# Patient Record
Sex: Male | Born: 1985
Health system: Southern US, Community
[De-identification: ages and names within clinical notes are randomized; demographics above are authoritative.]

---
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
## 2009-08-21 ENCOUNTER — Encounter: Admission: RE | Admit: 2009-08-21 | Discharge: 2009-09-13 | Payer: Self-pay | Admitting: Family Medicine

## 2009-10-18 ENCOUNTER — Encounter: Admission: RE | Admit: 2009-10-18 | Discharge: 2009-11-29 | Payer: Self-pay | Admitting: Orthopedic Surgery

## 2016-02-26 DIAGNOSIS — H5212 Myopia, left eye: Secondary | ICD-10-CM | POA: Diagnosis not present

## 2016-07-06 DIAGNOSIS — H6123 Impacted cerumen, bilateral: Secondary | ICD-10-CM | POA: Diagnosis not present

## 2016-11-11 ENCOUNTER — Other Ambulatory Visit: Payer: Self-pay | Admitting: *Deleted

## 2016-11-11 ENCOUNTER — Ambulatory Visit
Admission: RE | Admit: 2016-11-11 | Discharge: 2016-11-11 | Disposition: A | Discharge status: Home / Self Care | Payer: BLUE CROSS/BLUE SHIELD | Source: Ambulatory Visit | Attending: Sports Medicine | Admitting: Sports Medicine

## 2016-11-11 DIAGNOSIS — M25562 Pain in left knee: Secondary | ICD-10-CM

## 2016-11-11 DIAGNOSIS — S82122A Displaced fracture of lateral condyle of left tibia, initial encounter for closed fracture: Secondary | ICD-10-CM | POA: Diagnosis not present

## 2016-11-13 ENCOUNTER — Ambulatory Visit (INDEPENDENT_AMBULATORY_CARE_PROVIDER_SITE_OTHER): Payer: BLUE CROSS/BLUE SHIELD | Admitting: Family Medicine

## 2016-11-13 ENCOUNTER — Encounter (INDEPENDENT_AMBULATORY_CARE_PROVIDER_SITE_OTHER): Payer: Self-pay

## 2016-11-13 ENCOUNTER — Encounter: Payer: Self-pay | Admitting: Family Medicine

## 2016-11-13 VITALS — BP 135/82 | Ht 74.75 in | Wt 162.0 lb

## 2016-11-13 DIAGNOSIS — M25562 Pain in left knee: Secondary | ICD-10-CM

## 2016-11-13 NOTE — Progress Notes (Signed)
  Brent SarasWade Allan Chavez - 31 y.o. male MRN [id number]  Date of birth: 17-May-1986  SUBJECTIVE:  Including CC & ROS.   Mr. Brent Chavez is a 31 year old male that is presenting with left knee pain. He was playing in a basketball game on Sunday and had a couple different instances of hurting his knee. He denies hearing a pop. He did have some swelling on the lateral aspect of his knee. He had applied ice to it. He has been having trouble walking after the game. He feels some improvement today but still has some unsteadiness of his knee when he moves certain directions.  ROS: No unexpected weight loss, fever, chills, , instability, muscle pain, numbness/tingling, redness, otherwise see HPI    HISTORY: Past Medical, Surgical, Social, and Family History Reviewed & Updated per EMR.   Pertinent Historical Findings include: PSHx -  Never smoker   DATA REVIEWED: 11/11/16: Left knee x-ray:Small avulsion fracture from the proximal, lateral tibia suspicious for a Segond fracture. MRI is recommended to evaluate for internal derangement.  PHYSICAL EXAM:  VS: BP:135/82  HR: bpm  TEMP: ( )  RESP:   HT:6' 2.75" (189.9 cm)   WT:162 lb (73.5 kg)  BMI:20.4 PHYSICAL EXAM: Gen: NAD, alert, cooperative with exam, well-appearing HEENT: clear conjunctiva, EOMI CV:  no edema, capillary refill brisk,  Resp: non-labored, normal speech Skin: no rashes, normal turgor  Neuro: no gross deficits.  Psych:  alert and oriented Knee: Normal to inspection with no erythema  Trace effusion  Palpation normal with no warmth, joint line tenderness, patellar tenderness, or condyle tenderness. ROM full in flexion and extension and lower leg rotation. Normal and points with valgus and varus stress testing. Negative Mcmurray's Positive Lauchman's. Non painful patellar compression. Patellar glide without crepitus. Patellar and quadriceps tendons unremarkable. Hamstring and quadriceps strength is normal.     ASSESSMENT &  PLAN:   Left knee pain Exam and his x-ray would be consistent with an anterior cruciate ligament tear. - MRI left knee - We will call with results

## 2016-11-15 ENCOUNTER — Encounter: Payer: Self-pay | Admitting: Family Medicine

## 2016-11-15 DIAGNOSIS — M25562 Pain in left knee: Secondary | ICD-10-CM | POA: Insufficient documentation

## 2016-11-15 NOTE — Assessment & Plan Note (Signed)
Exam and his x-ray would be consistent with an anterior cruciate ligament tear. - MRI left knee - We will call with results

## 2016-11-17 ENCOUNTER — Ambulatory Visit
Admission: RE | Admit: 2016-11-17 | Discharge: 2016-11-17 | Disposition: A | Discharge status: Home / Self Care | Payer: BLUE CROSS/BLUE SHIELD | Source: Ambulatory Visit | Attending: Sports Medicine | Admitting: Sports Medicine

## 2016-11-17 DIAGNOSIS — M25562 Pain in left knee: Secondary | ICD-10-CM

## 2016-11-17 DIAGNOSIS — M25462 Effusion, left knee: Secondary | ICD-10-CM | POA: Diagnosis not present

## 2016-11-21 DIAGNOSIS — S83512A Sprain of anterior cruciate ligament of left knee, initial encounter: Secondary | ICD-10-CM | POA: Diagnosis not present

## 2016-12-04 DIAGNOSIS — S83502A Sprain of unspecified cruciate ligament of left knee, initial encounter: Secondary | ICD-10-CM | POA: Diagnosis not present

## 2016-12-04 DIAGNOSIS — Y9364 Activity, baseball: Secondary | ICD-10-CM | POA: Diagnosis not present

## 2016-12-04 DIAGNOSIS — S83282A Other tear of lateral meniscus, current injury, left knee, initial encounter: Secondary | ICD-10-CM | POA: Diagnosis not present

## 2016-12-04 DIAGNOSIS — G8918 Other acute postprocedural pain: Secondary | ICD-10-CM | POA: Diagnosis not present

## 2016-12-04 DIAGNOSIS — S83512A Sprain of anterior cruciate ligament of left knee, initial encounter: Secondary | ICD-10-CM | POA: Diagnosis not present

## 2016-12-12 DIAGNOSIS — S83282D Other tear of lateral meniscus, current injury, left knee, subsequent encounter: Secondary | ICD-10-CM | POA: Diagnosis not present

## 2016-12-19 DIAGNOSIS — M25662 Stiffness of left knee, not elsewhere classified: Secondary | ICD-10-CM | POA: Diagnosis not present

## 2016-12-19 DIAGNOSIS — R262 Difficulty in walking, not elsewhere classified: Secondary | ICD-10-CM | POA: Diagnosis not present

## 2016-12-19 DIAGNOSIS — M6281 Muscle weakness (generalized): Secondary | ICD-10-CM | POA: Diagnosis not present

## 2016-12-19 DIAGNOSIS — S83282D Other tear of lateral meniscus, current injury, left knee, subsequent encounter: Secondary | ICD-10-CM | POA: Diagnosis not present

## 2016-12-19 DIAGNOSIS — M25562 Pain in left knee: Secondary | ICD-10-CM | POA: Diagnosis not present

## 2016-12-24 DIAGNOSIS — M6281 Muscle weakness (generalized): Secondary | ICD-10-CM | POA: Diagnosis not present

## 2016-12-24 DIAGNOSIS — R262 Difficulty in walking, not elsewhere classified: Secondary | ICD-10-CM | POA: Diagnosis not present

## 2016-12-24 DIAGNOSIS — M25562 Pain in left knee: Secondary | ICD-10-CM | POA: Diagnosis not present

## 2016-12-24 DIAGNOSIS — M25662 Stiffness of left knee, not elsewhere classified: Secondary | ICD-10-CM | POA: Diagnosis not present

## 2016-12-26 DIAGNOSIS — M25662 Stiffness of left knee, not elsewhere classified: Secondary | ICD-10-CM | POA: Diagnosis not present

## 2016-12-26 DIAGNOSIS — M25562 Pain in left knee: Secondary | ICD-10-CM | POA: Diagnosis not present

## 2016-12-26 DIAGNOSIS — R262 Difficulty in walking, not elsewhere classified: Secondary | ICD-10-CM | POA: Diagnosis not present

## 2016-12-26 DIAGNOSIS — M6281 Muscle weakness (generalized): Secondary | ICD-10-CM | POA: Diagnosis not present

## 2016-12-31 DIAGNOSIS — R262 Difficulty in walking, not elsewhere classified: Secondary | ICD-10-CM | POA: Diagnosis not present

## 2016-12-31 DIAGNOSIS — M6281 Muscle weakness (generalized): Secondary | ICD-10-CM | POA: Diagnosis not present

## 2016-12-31 DIAGNOSIS — M25562 Pain in left knee: Secondary | ICD-10-CM | POA: Diagnosis not present

## 2016-12-31 DIAGNOSIS — M25662 Stiffness of left knee, not elsewhere classified: Secondary | ICD-10-CM | POA: Diagnosis not present

## 2017-01-02 DIAGNOSIS — M6281 Muscle weakness (generalized): Secondary | ICD-10-CM | POA: Diagnosis not present

## 2017-01-02 DIAGNOSIS — R262 Difficulty in walking, not elsewhere classified: Secondary | ICD-10-CM | POA: Diagnosis not present

## 2017-01-02 DIAGNOSIS — M25562 Pain in left knee: Secondary | ICD-10-CM | POA: Diagnosis not present

## 2017-01-02 DIAGNOSIS — M25662 Stiffness of left knee, not elsewhere classified: Secondary | ICD-10-CM | POA: Diagnosis not present

## 2017-01-07 DIAGNOSIS — R262 Difficulty in walking, not elsewhere classified: Secondary | ICD-10-CM | POA: Diagnosis not present

## 2017-01-07 DIAGNOSIS — M25662 Stiffness of left knee, not elsewhere classified: Secondary | ICD-10-CM | POA: Diagnosis not present

## 2017-01-07 DIAGNOSIS — M25562 Pain in left knee: Secondary | ICD-10-CM | POA: Diagnosis not present

## 2017-01-07 DIAGNOSIS — M6281 Muscle weakness (generalized): Secondary | ICD-10-CM | POA: Diagnosis not present

## 2017-01-09 DIAGNOSIS — M25662 Stiffness of left knee, not elsewhere classified: Secondary | ICD-10-CM | POA: Diagnosis not present

## 2017-01-09 DIAGNOSIS — M6281 Muscle weakness (generalized): Secondary | ICD-10-CM | POA: Diagnosis not present

## 2017-01-09 DIAGNOSIS — M25562 Pain in left knee: Secondary | ICD-10-CM | POA: Diagnosis not present

## 2017-01-09 DIAGNOSIS — R262 Difficulty in walking, not elsewhere classified: Secondary | ICD-10-CM | POA: Diagnosis not present

## 2017-01-14 DIAGNOSIS — M25662 Stiffness of left knee, not elsewhere classified: Secondary | ICD-10-CM | POA: Diagnosis not present

## 2017-01-14 DIAGNOSIS — M25562 Pain in left knee: Secondary | ICD-10-CM | POA: Diagnosis not present

## 2017-01-14 DIAGNOSIS — R262 Difficulty in walking, not elsewhere classified: Secondary | ICD-10-CM | POA: Diagnosis not present

## 2017-01-14 DIAGNOSIS — M6281 Muscle weakness (generalized): Secondary | ICD-10-CM | POA: Diagnosis not present

## 2017-01-16 DIAGNOSIS — M6281 Muscle weakness (generalized): Secondary | ICD-10-CM | POA: Diagnosis not present

## 2017-01-16 DIAGNOSIS — R262 Difficulty in walking, not elsewhere classified: Secondary | ICD-10-CM | POA: Diagnosis not present

## 2017-01-16 DIAGNOSIS — M25662 Stiffness of left knee, not elsewhere classified: Secondary | ICD-10-CM | POA: Diagnosis not present

## 2017-01-16 DIAGNOSIS — M25562 Pain in left knee: Secondary | ICD-10-CM | POA: Diagnosis not present

## 2017-01-21 DIAGNOSIS — M6281 Muscle weakness (generalized): Secondary | ICD-10-CM | POA: Diagnosis not present

## 2017-01-21 DIAGNOSIS — M25662 Stiffness of left knee, not elsewhere classified: Secondary | ICD-10-CM | POA: Diagnosis not present

## 2017-01-21 DIAGNOSIS — M25562 Pain in left knee: Secondary | ICD-10-CM | POA: Diagnosis not present

## 2017-01-21 DIAGNOSIS — R262 Difficulty in walking, not elsewhere classified: Secondary | ICD-10-CM | POA: Diagnosis not present

## 2017-01-23 DIAGNOSIS — M25662 Stiffness of left knee, not elsewhere classified: Secondary | ICD-10-CM | POA: Diagnosis not present

## 2017-01-23 DIAGNOSIS — R262 Difficulty in walking, not elsewhere classified: Secondary | ICD-10-CM | POA: Diagnosis not present

## 2017-01-23 DIAGNOSIS — M6281 Muscle weakness (generalized): Secondary | ICD-10-CM | POA: Diagnosis not present

## 2017-01-23 DIAGNOSIS — M25562 Pain in left knee: Secondary | ICD-10-CM | POA: Diagnosis not present

## 2017-01-28 DIAGNOSIS — M6281 Muscle weakness (generalized): Secondary | ICD-10-CM | POA: Diagnosis not present

## 2017-01-28 DIAGNOSIS — R262 Difficulty in walking, not elsewhere classified: Secondary | ICD-10-CM | POA: Diagnosis not present

## 2017-01-28 DIAGNOSIS — M25662 Stiffness of left knee, not elsewhere classified: Secondary | ICD-10-CM | POA: Diagnosis not present

## 2017-01-28 DIAGNOSIS — M25562 Pain in left knee: Secondary | ICD-10-CM | POA: Diagnosis not present

## 2017-01-30 DIAGNOSIS — M25662 Stiffness of left knee, not elsewhere classified: Secondary | ICD-10-CM | POA: Diagnosis not present

## 2017-01-30 DIAGNOSIS — M6281 Muscle weakness (generalized): Secondary | ICD-10-CM | POA: Diagnosis not present

## 2017-01-30 DIAGNOSIS — R262 Difficulty in walking, not elsewhere classified: Secondary | ICD-10-CM | POA: Diagnosis not present

## 2017-01-30 DIAGNOSIS — M25562 Pain in left knee: Secondary | ICD-10-CM | POA: Diagnosis not present

## 2017-02-12 DIAGNOSIS — R262 Difficulty in walking, not elsewhere classified: Secondary | ICD-10-CM | POA: Diagnosis not present

## 2017-02-12 DIAGNOSIS — M25562 Pain in left knee: Secondary | ICD-10-CM | POA: Diagnosis not present

## 2017-02-12 DIAGNOSIS — M6281 Muscle weakness (generalized): Secondary | ICD-10-CM | POA: Diagnosis not present

## 2017-02-12 DIAGNOSIS — M25662 Stiffness of left knee, not elsewhere classified: Secondary | ICD-10-CM | POA: Diagnosis not present

## 2017-02-18 DIAGNOSIS — R262 Difficulty in walking, not elsewhere classified: Secondary | ICD-10-CM | POA: Diagnosis not present

## 2017-02-18 DIAGNOSIS — M25562 Pain in left knee: Secondary | ICD-10-CM | POA: Diagnosis not present

## 2017-02-18 DIAGNOSIS — M6281 Muscle weakness (generalized): Secondary | ICD-10-CM | POA: Diagnosis not present

## 2017-02-18 DIAGNOSIS — M25662 Stiffness of left knee, not elsewhere classified: Secondary | ICD-10-CM | POA: Diagnosis not present

## 2017-02-20 DIAGNOSIS — M25562 Pain in left knee: Secondary | ICD-10-CM | POA: Diagnosis not present

## 2017-02-20 DIAGNOSIS — M6281 Muscle weakness (generalized): Secondary | ICD-10-CM | POA: Diagnosis not present

## 2017-02-20 DIAGNOSIS — M25662 Stiffness of left knee, not elsewhere classified: Secondary | ICD-10-CM | POA: Diagnosis not present

## 2017-02-20 DIAGNOSIS — R262 Difficulty in walking, not elsewhere classified: Secondary | ICD-10-CM | POA: Diagnosis not present

## 2017-02-27 DIAGNOSIS — M25662 Stiffness of left knee, not elsewhere classified: Secondary | ICD-10-CM | POA: Diagnosis not present

## 2017-02-27 DIAGNOSIS — R262 Difficulty in walking, not elsewhere classified: Secondary | ICD-10-CM | POA: Diagnosis not present

## 2017-02-27 DIAGNOSIS — M25562 Pain in left knee: Secondary | ICD-10-CM | POA: Diagnosis not present

## 2017-02-27 DIAGNOSIS — M6281 Muscle weakness (generalized): Secondary | ICD-10-CM | POA: Diagnosis not present

## 2017-03-05 DIAGNOSIS — M25562 Pain in left knee: Secondary | ICD-10-CM | POA: Diagnosis not present

## 2017-03-05 DIAGNOSIS — M25662 Stiffness of left knee, not elsewhere classified: Secondary | ICD-10-CM | POA: Diagnosis not present

## 2017-03-05 DIAGNOSIS — R262 Difficulty in walking, not elsewhere classified: Secondary | ICD-10-CM | POA: Diagnosis not present

## 2017-03-05 DIAGNOSIS — M6281 Muscle weakness (generalized): Secondary | ICD-10-CM | POA: Diagnosis not present

## 2017-03-06 DIAGNOSIS — R262 Difficulty in walking, not elsewhere classified: Secondary | ICD-10-CM | POA: Diagnosis not present

## 2017-03-06 DIAGNOSIS — M6281 Muscle weakness (generalized): Secondary | ICD-10-CM | POA: Diagnosis not present

## 2017-03-06 DIAGNOSIS — M25562 Pain in left knee: Secondary | ICD-10-CM | POA: Diagnosis not present

## 2017-03-06 DIAGNOSIS — M25662 Stiffness of left knee, not elsewhere classified: Secondary | ICD-10-CM | POA: Diagnosis not present

## 2017-03-13 DIAGNOSIS — R262 Difficulty in walking, not elsewhere classified: Secondary | ICD-10-CM | POA: Diagnosis not present

## 2017-03-13 DIAGNOSIS — M25662 Stiffness of left knee, not elsewhere classified: Secondary | ICD-10-CM | POA: Diagnosis not present

## 2017-03-13 DIAGNOSIS — M25562 Pain in left knee: Secondary | ICD-10-CM | POA: Diagnosis not present

## 2017-03-13 DIAGNOSIS — M6281 Muscle weakness (generalized): Secondary | ICD-10-CM | POA: Diagnosis not present

## 2017-04-10 DIAGNOSIS — M25562 Pain in left knee: Secondary | ICD-10-CM | POA: Diagnosis not present

## 2017-04-24 DIAGNOSIS — M6281 Muscle weakness (generalized): Secondary | ICD-10-CM | POA: Diagnosis not present

## 2017-04-24 DIAGNOSIS — M25662 Stiffness of left knee, not elsewhere classified: Secondary | ICD-10-CM | POA: Diagnosis not present

## 2017-04-24 DIAGNOSIS — R262 Difficulty in walking, not elsewhere classified: Secondary | ICD-10-CM | POA: Diagnosis not present

## 2017-04-24 DIAGNOSIS — M25562 Pain in left knee: Secondary | ICD-10-CM | POA: Diagnosis not present

## 2017-05-15 DIAGNOSIS — S83511A Sprain of anterior cruciate ligament of right knee, initial encounter: Secondary | ICD-10-CM | POA: Diagnosis not present

## 2017-06-26 DIAGNOSIS — S83511D Sprain of anterior cruciate ligament of right knee, subsequent encounter: Secondary | ICD-10-CM | POA: Diagnosis not present

## 2017-07-02 DIAGNOSIS — M23612 Other spontaneous disruption of anterior cruciate ligament of left knee: Secondary | ICD-10-CM | POA: Diagnosis not present

## 2017-07-02 DIAGNOSIS — R531 Weakness: Secondary | ICD-10-CM | POA: Diagnosis not present

## 2017-07-09 DIAGNOSIS — M23612 Other spontaneous disruption of anterior cruciate ligament of left knee: Secondary | ICD-10-CM | POA: Diagnosis not present

## 2017-07-09 DIAGNOSIS — R531 Weakness: Secondary | ICD-10-CM | POA: Diagnosis not present

## 2017-07-16 DIAGNOSIS — M23612 Other spontaneous disruption of anterior cruciate ligament of left knee: Secondary | ICD-10-CM | POA: Diagnosis not present

## 2017-07-16 DIAGNOSIS — R531 Weakness: Secondary | ICD-10-CM | POA: Diagnosis not present

## 2017-07-23 DIAGNOSIS — M23612 Other spontaneous disruption of anterior cruciate ligament of left knee: Secondary | ICD-10-CM | POA: Diagnosis not present

## 2017-07-23 DIAGNOSIS — R531 Weakness: Secondary | ICD-10-CM | POA: Diagnosis not present

## 2017-07-29 DIAGNOSIS — R531 Weakness: Secondary | ICD-10-CM | POA: Diagnosis not present

## 2017-07-29 DIAGNOSIS — M23612 Other spontaneous disruption of anterior cruciate ligament of left knee: Secondary | ICD-10-CM | POA: Diagnosis not present

## 2017-08-05 DIAGNOSIS — M23612 Other spontaneous disruption of anterior cruciate ligament of left knee: Secondary | ICD-10-CM | POA: Diagnosis not present

## 2017-08-06 DIAGNOSIS — M23612 Other spontaneous disruption of anterior cruciate ligament of left knee: Secondary | ICD-10-CM | POA: Diagnosis not present

## 2017-08-06 DIAGNOSIS — R531 Weakness: Secondary | ICD-10-CM | POA: Diagnosis not present

## 2017-08-19 DIAGNOSIS — M23612 Other spontaneous disruption of anterior cruciate ligament of left knee: Secondary | ICD-10-CM | POA: Diagnosis not present

## 2017-08-19 DIAGNOSIS — R531 Weakness: Secondary | ICD-10-CM | POA: Diagnosis not present

## 2017-09-04 DIAGNOSIS — R531 Weakness: Secondary | ICD-10-CM | POA: Diagnosis not present

## 2017-09-04 DIAGNOSIS — M23612 Other spontaneous disruption of anterior cruciate ligament of left knee: Secondary | ICD-10-CM | POA: Diagnosis not present

## 2017-09-25 DIAGNOSIS — M23612 Other spontaneous disruption of anterior cruciate ligament of left knee: Secondary | ICD-10-CM | POA: Diagnosis not present

## 2017-11-04 DIAGNOSIS — R52 Pain, unspecified: Secondary | ICD-10-CM | POA: Diagnosis not present

## 2017-11-04 DIAGNOSIS — R509 Fever, unspecified: Secondary | ICD-10-CM | POA: Diagnosis not present

## 2017-11-13 DIAGNOSIS — S83511D Sprain of anterior cruciate ligament of right knee, subsequent encounter: Secondary | ICD-10-CM | POA: Diagnosis not present

## 2018-01-13 DIAGNOSIS — S83511D Sprain of anterior cruciate ligament of right knee, subsequent encounter: Secondary | ICD-10-CM | POA: Diagnosis not present

## 2018-03-17 DIAGNOSIS — S83511D Sprain of anterior cruciate ligament of right knee, subsequent encounter: Secondary | ICD-10-CM | POA: Diagnosis not present

## 2018-03-27 DIAGNOSIS — M25562 Pain in left knee: Secondary | ICD-10-CM | POA: Diagnosis not present

## 2019-01-28 IMAGING — MR MR KNEE*L* W/O CM
3 of 6 series · 9 of 40 positions shown · non-contrast
Comparison: Radiographs 11/11/2016

CLINICAL DATA: Injured knee playing basketball 1 week ago.
Persistent pain, swelling and inflammation.

EXAM:
MRI OF THE LEFT KNEE WITHOUT CONTRAST
TECHNIQUE: Multiplanar, multisequence MR imaging of the knee was performed. No
intravenous contrast was administered.

[Series 6: PD fat-sat · sagittal · 4.0mm · 0.21mm/px · 3 of 24 slices shown]
[im 4/24]
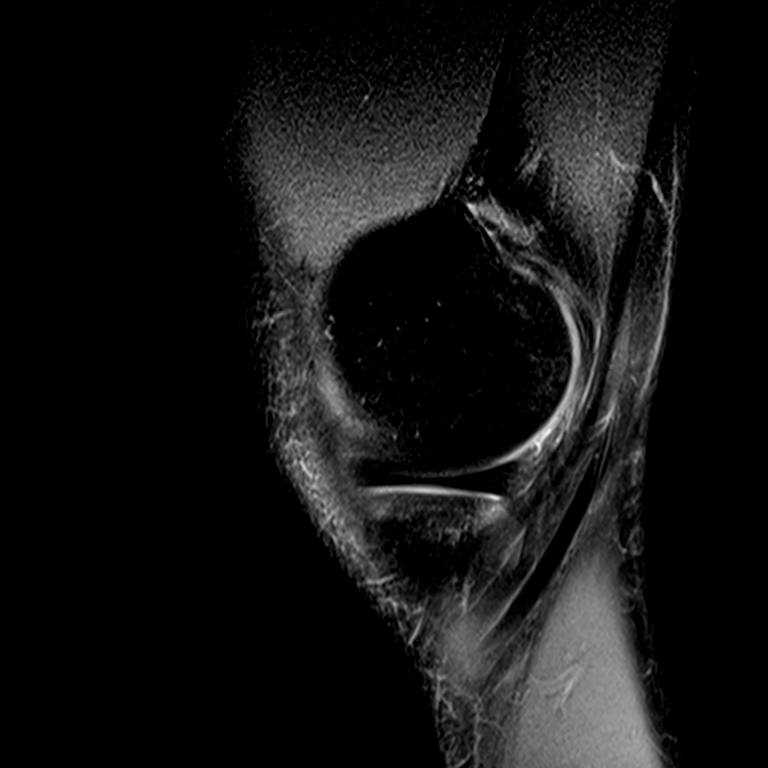
[im 12/24]
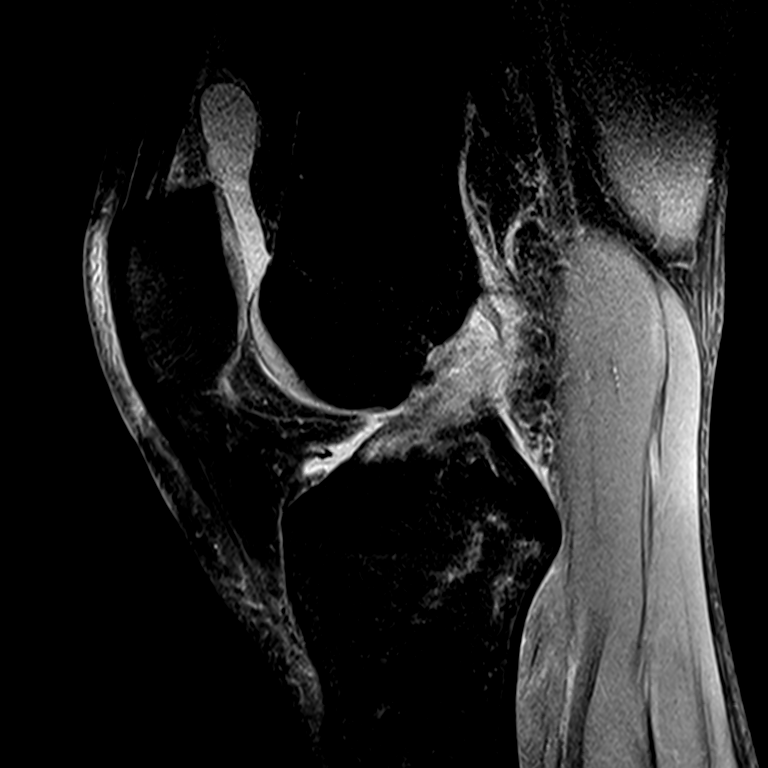
[im 20/24]
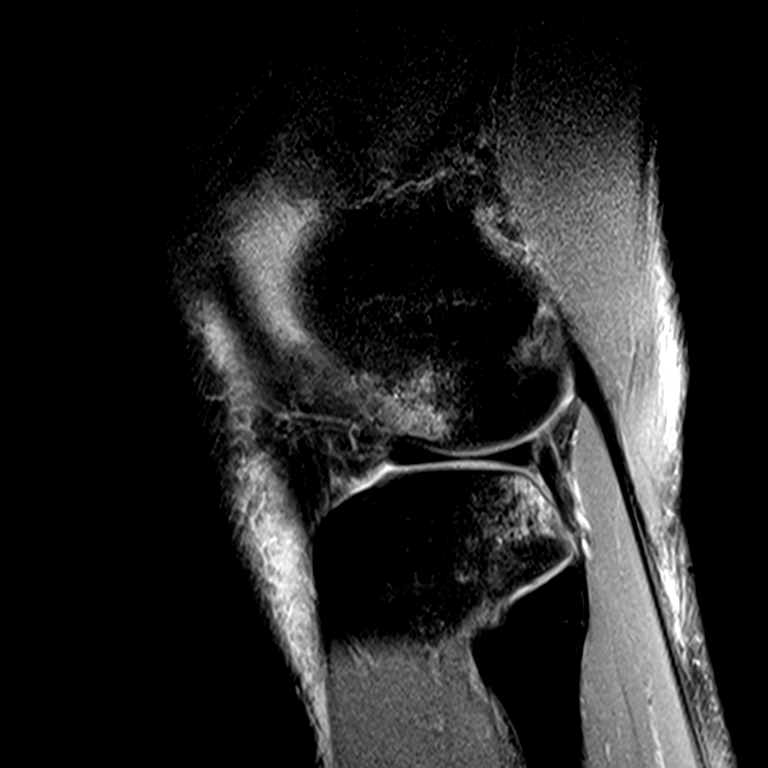

[Series 7: T2 fat-sat · coronal · 4.0mm · 0.21mm/px · 3 of 26 slices shown]
[im 5/26]
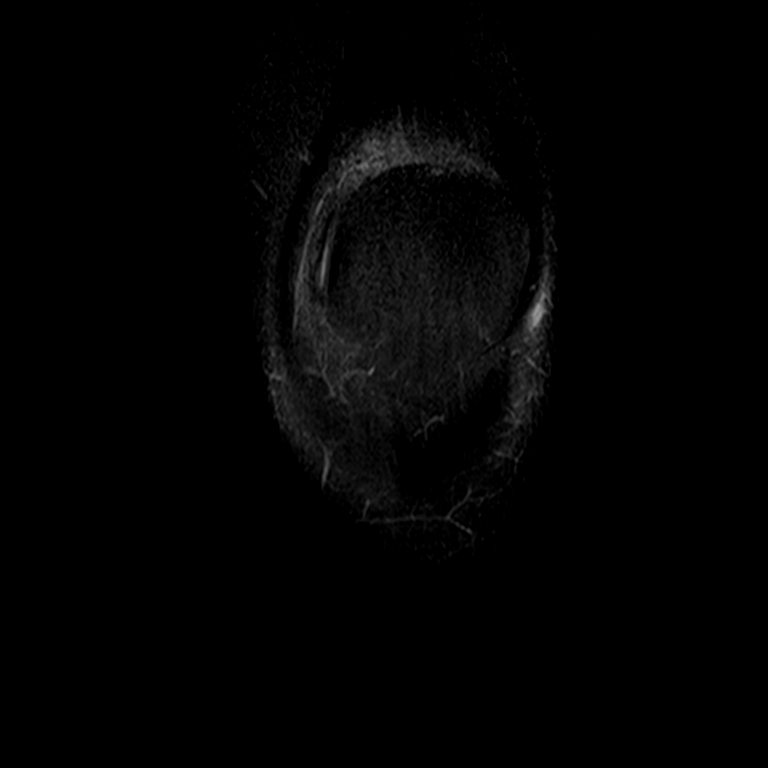
[im 13/26]
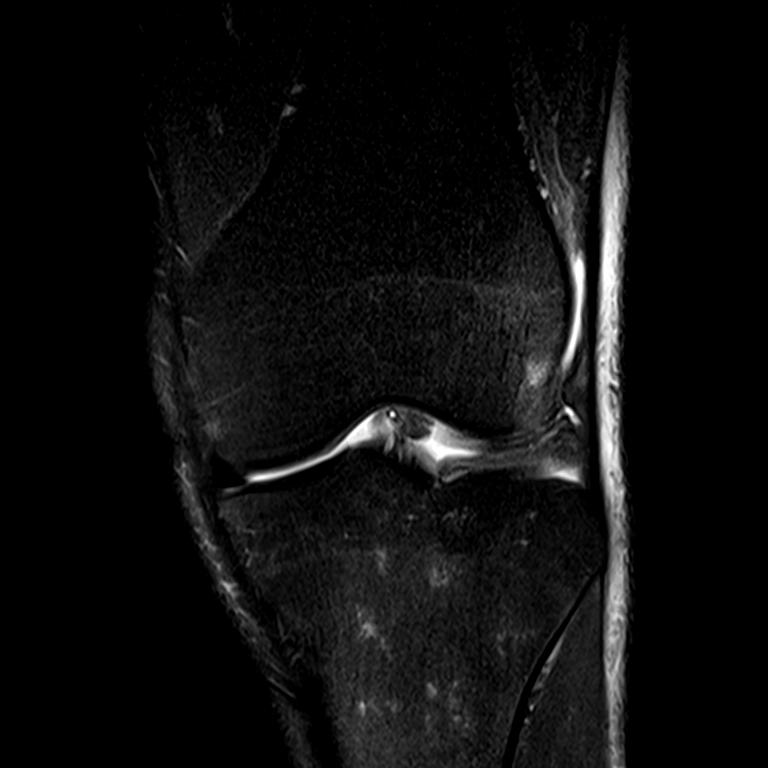
[im 21/26]
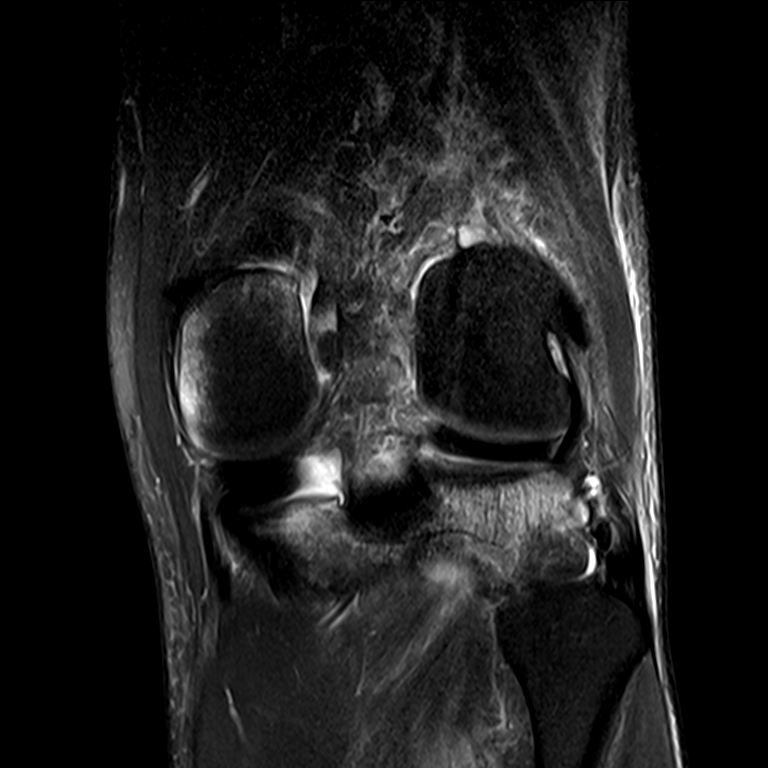

[Series 8: T1 · coronal · 4.0mm · 0.21mm/px · 3 of 23 slices shown]
[im 4/23]
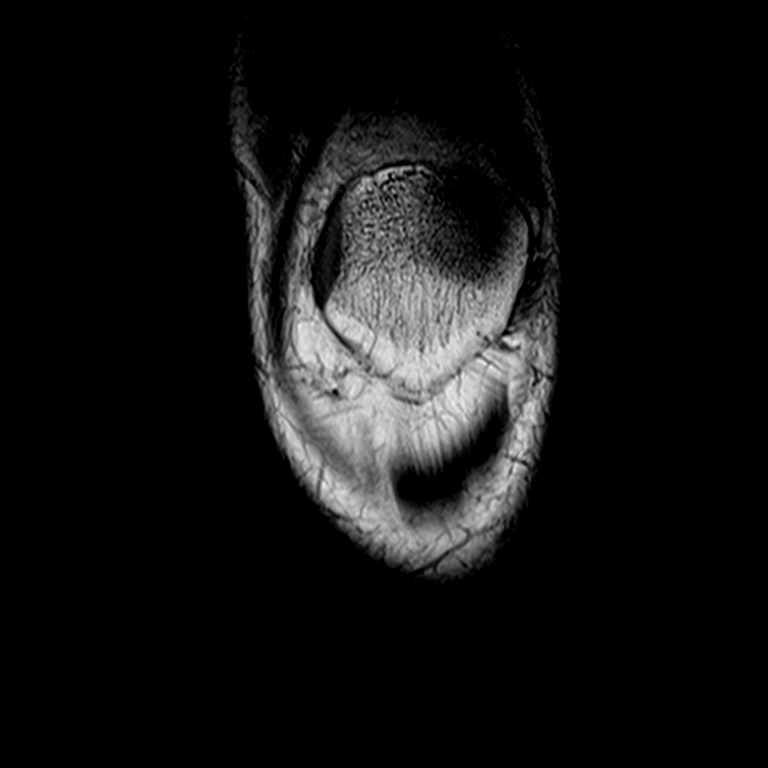
[im 12/23]
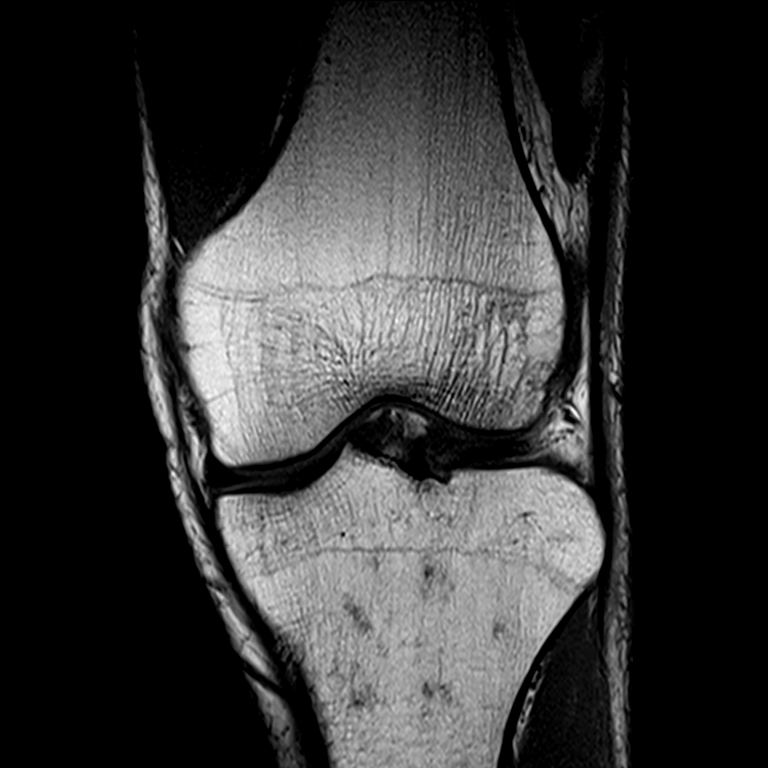
[im 19/23]
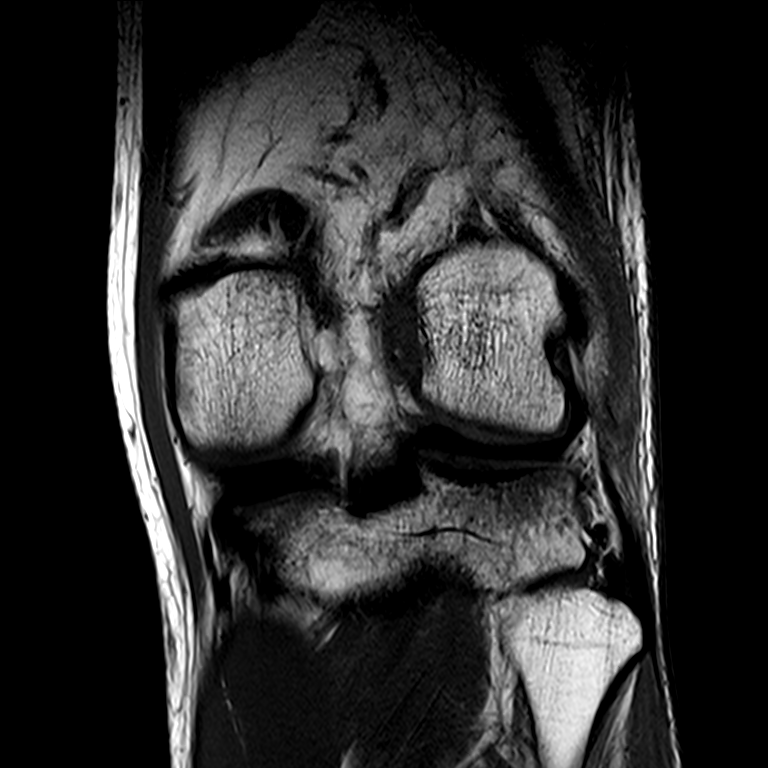

[9 of 40 positions shown; findings below may reference images not displayed]

FINDINGS: MENISCI

Medial meniscus:  Intact.

Lateral meniscus:  Intact.

LIGAMENTS

Cruciates:  Complete midsubstance ACL rupture.  The PCL is intact.

Collaterals:  Intact.

CARTILAGE

Patellofemoral:  Normal

Medial:  Normal

Lateral:  Normal

Joint: Large joint effusion. Superior and medial patellar plica are
noted.

Popliteal Fossa:  No popliteal mass or Baker's cyst.

Extensor Mechanism: The patella retinacular structures are intact
and the quadriceps and patellar tendons are intact.

Bones: Extensive pivot-shift bone contusions along with a bone
contusion involving the posterior aspect of the medial femoral
condyle. No discrete fracture.

Other: The knee musculature appears normal.
IMPRESSION: 1. Complete midsubstance ACL rupture with typical pivot-shift bone
contusions. The PCL and collateral ligaments are intact.
2. No meniscal tear is and intact articular cartilage.
3. Large joint effusion.

## 2019-11-06 ENCOUNTER — Ambulatory Visit: Payer: Self-pay | Attending: Internal Medicine

## 2019-11-06 DIAGNOSIS — Z23 Encounter for immunization: Secondary | ICD-10-CM | POA: Insufficient documentation

## 2019-11-06 NOTE — Progress Notes (Signed)
   Covid-19 Vaccination Clinic  Name:  [name]    MRN: [id number] DOB: 1985/10/28  11/06/2019  Mr. Mccoin was observed post Covid-19 immunization for 15 minutes without incidence. He was provided with Vaccine Information Sheet and instruction to access the V-Safe system.   Mr. Burstein was instructed to call 911 with any severe reactions post vaccine: Marland Kitchen Difficulty breathing  . Swelling of your face and throat  . A fast heartbeat  . A bad rash all over your body  . Dizziness and weakness    Immunizations Administered    Name Date Dose VIS Date Route   Pfizer COVID-19 Vaccine 11/06/2019 12:52 PM 0.3 mL 08/27/2019 Intramuscular   Manufacturer: [employer], [employer]   Lot: [id number]   NDC: [address]-3

## 2019-11-29 ENCOUNTER — Ambulatory Visit: Payer: Self-pay | Attending: Internal Medicine

## 2019-11-29 DIAGNOSIS — Z23 Encounter for immunization: Secondary | ICD-10-CM

## 2019-11-29 NOTE — Progress Notes (Signed)
   Covid-19 Vaccination Clinic  Name:  Yusif Gnau    MRN: [id number] DOB: [birth year]  11/29/2019  Mr. Hicks was observed post Covid-19 immunization for 15 minutes without incident. He was provided with Vaccine Information Sheet and instruction to access the V-Safe system.   Mr. Wainer was instructed to call 911 with any severe reactions post vaccine: Marland Kitchen Difficulty breathing  . Swelling of face and throat  . A fast heartbeat  . A bad rash all over body  . Dizziness and weakness   Immunizations Administered    Name Date Dose VIS Date Route   Pfizer COVID-19 Vaccine 11/29/2019 12:33 PM 0.3 mL 08/27/2019 Intramuscular   Manufacturer: [employer], [employer]   Lot: [id number]   NDC: [address]-8

## 2020-01-20 DIAGNOSIS — Z13220 Encounter for screening for lipoid disorders: Secondary | ICD-10-CM | POA: Diagnosis not present

## 2020-01-20 DIAGNOSIS — Z0000 Encounter for general adult medical examination without abnormal findings: Secondary | ICD-10-CM | POA: Diagnosis not present

## 2020-07-06 DIAGNOSIS — Z20822 Contact with and (suspected) exposure to covid-19: Secondary | ICD-10-CM | POA: Diagnosis not present

## 2020-07-20 ENCOUNTER — Other Ambulatory Visit: Payer: Self-pay

## 2020-07-20 DIAGNOSIS — Z20822 Contact with and (suspected) exposure to covid-19: Secondary | ICD-10-CM | POA: Diagnosis not present

## 2020-07-22 LAB — SARS-COV-2, NAA 2 DAY TAT

## 2020-07-22 LAB — NOVEL CORONAVIRUS, NAA: SARS-CoV-2, NAA: DETECTED — AB

## 2020-07-23 ENCOUNTER — Telehealth: Payer: Self-pay | Admitting: Physician Assistant

## 2020-07-23 NOTE — Telephone Encounter (Signed)
Called to Discuss with patient about Covid symptoms and the use of the monoclonal antibody infusion for those with mild to moderate Covid symptoms and at a high risk of hospitalization.     Pt appears to qualify for this infusion due to co-morbid conditions and/or a member of an at-risk group in accordance with the FDA Emergency Use Authorization.    Unable to reach pt. Left voice mail to call back.   

## 2020-09-20 ENCOUNTER — Other Ambulatory Visit (HOSPITAL_COMMUNITY): Payer: Self-pay | Admitting: Internal Medicine

## 2020-09-20 ENCOUNTER — Ambulatory Visit: Payer: Self-pay | Attending: Internal Medicine

## 2020-09-20 DIAGNOSIS — Z23 Encounter for immunization: Secondary | ICD-10-CM

## 2020-09-20 NOTE — Progress Notes (Signed)
   Covid-19 Vaccination Clinic  Name:  Brent Chavez    MRN: [id number] DOB: Feb 02, 1986  09/20/2020  Brent Chavez was observed post Covid-19 immunization for 15 minutes without incident. He was provided with Vaccine Information Sheet and instruction to access the V-Safe system.   Brent Chavez was instructed to call 911 with any severe reactions post vaccine: Marland Kitchen Difficulty breathing  . Swelling of face and throat  . A fast heartbeat  . A bad rash all over body  . Dizziness and weakness   Immunizations Administered    Name Date Dose VIS Date Route   Pfizer COVID-19 Vaccine 09/20/2020 11:58 AM 0.3 mL 07/05/2020 Intramuscular   Manufacturer: [employer], [employer]   Lot: [id number]   NDC: [address]-6

## 2021-10-20 DIAGNOSIS — H6123 Impacted cerumen, bilateral: Secondary | ICD-10-CM | POA: Diagnosis not present

## 2022-03-15 DIAGNOSIS — H0100A Unspecified blepharitis right eye, upper and lower eyelids: Secondary | ICD-10-CM | POA: Diagnosis not present

## 2024-02-04 DIAGNOSIS — H6122 Impacted cerumen, left ear: Secondary | ICD-10-CM | POA: Diagnosis not present

## 2024-03-22 DIAGNOSIS — R051 Acute cough: Secondary | ICD-10-CM | POA: Diagnosis not present

## 2024-03-22 DIAGNOSIS — J40 Bronchitis, not specified as acute or chronic: Secondary | ICD-10-CM | POA: Diagnosis not present

## 2024-03-22 DIAGNOSIS — R062 Wheezing: Secondary | ICD-10-CM | POA: Diagnosis not present

## 2024-08-24 DIAGNOSIS — Z131 Encounter for screening for diabetes mellitus: Secondary | ICD-10-CM | POA: Diagnosis not present

## 2024-08-24 DIAGNOSIS — E782 Mixed hyperlipidemia: Secondary | ICD-10-CM | POA: Diagnosis not present

## 2024-08-24 DIAGNOSIS — Z0000 Encounter for general adult medical examination without abnormal findings: Secondary | ICD-10-CM | POA: Diagnosis not present
